# Patient Record
Sex: Female | Born: 1985 | Race: White | Hispanic: No | Smoking: Never smoker
Health system: Southern US, Community
[De-identification: ages and names within clinical notes are randomized; demographics above are authoritative.]

## PROBLEM LIST (undated history)

## (undated) DIAGNOSIS — F419 Anxiety disorder, unspecified: Secondary | ICD-10-CM

## (undated) DIAGNOSIS — J45909 Unspecified asthma, uncomplicated: Secondary | ICD-10-CM

## (undated) HISTORY — PX: DILATION AND CURETTAGE OF UTERUS: SHX78

## (undated) HISTORY — PX: TONSILLECTOMY: SUR1361

---

## 2021-03-04 ENCOUNTER — Other Ambulatory Visit: Payer: Self-pay | Admitting: Family Medicine

## 2021-03-04 DIAGNOSIS — R928 Other abnormal and inconclusive findings on diagnostic imaging of breast: Secondary | ICD-10-CM

## 2021-03-20 ENCOUNTER — Other Ambulatory Visit: Payer: Self-pay | Admitting: Family Medicine

## 2021-03-20 ENCOUNTER — Ambulatory Visit
Admission: RE | Admit: 2021-03-20 | Discharge: 2021-03-20 | Disposition: A | Discharge status: Home / Self Care | Payer: Self-pay | Source: Ambulatory Visit | Attending: Family Medicine | Admitting: Family Medicine

## 2021-03-20 ENCOUNTER — Ambulatory Visit
Admission: RE | Admit: 2021-03-20 | Discharge: 2021-03-20 | Disposition: A | Discharge status: Home / Self Care | Payer: Medicaid Other | Source: Ambulatory Visit | Attending: Family Medicine | Admitting: Family Medicine

## 2021-03-20 ENCOUNTER — Other Ambulatory Visit: Payer: Self-pay

## 2021-03-20 DIAGNOSIS — R928 Other abnormal and inconclusive findings on diagnostic imaging of breast: Secondary | ICD-10-CM

## 2021-03-20 DIAGNOSIS — N6310 Unspecified lump in the right breast, unspecified quadrant: Secondary | ICD-10-CM

## 2021-09-19 ENCOUNTER — Inpatient Hospital Stay: Admission: RE | Admit: 2021-09-19 | Payer: Medicaid Other | Source: Ambulatory Visit

## 2021-12-03 ENCOUNTER — Other Ambulatory Visit: Payer: Medicaid Other

## 2021-12-06 ENCOUNTER — Inpatient Hospital Stay: Admission: RE | Admit: 2021-12-06 | Payer: Medicaid Other | Source: Ambulatory Visit

## 2021-12-17 ENCOUNTER — Other Ambulatory Visit: Payer: Self-pay | Admitting: Family Medicine

## 2021-12-17 ENCOUNTER — Ambulatory Visit
Admission: RE | Admit: 2021-12-17 | Discharge: 2021-12-17 | Disposition: A | Discharge status: Home / Self Care | Payer: Medicaid Other | Source: Ambulatory Visit | Attending: Family Medicine | Admitting: Family Medicine

## 2021-12-17 DIAGNOSIS — N6310 Unspecified lump in the right breast, unspecified quadrant: Secondary | ICD-10-CM

## 2021-12-18 ENCOUNTER — Ambulatory Visit
Admission: RE | Admit: 2021-12-18 | Discharge: 2021-12-18 | Disposition: A | Discharge status: Home / Self Care | Payer: Medicaid Other | Source: Ambulatory Visit | Attending: Family Medicine | Admitting: Family Medicine

## 2021-12-18 DIAGNOSIS — N6310 Unspecified lump in the right breast, unspecified quadrant: Secondary | ICD-10-CM

## 2022-01-03 ENCOUNTER — Ambulatory Visit: Payer: Self-pay | Admitting: Surgery

## 2022-01-03 DIAGNOSIS — N63 Unspecified lump in unspecified breast: Secondary | ICD-10-CM

## 2022-01-03 DIAGNOSIS — N6341 Unspecified lump in right breast, subareolar: Secondary | ICD-10-CM

## 2022-01-03 HISTORY — DX: Unspecified lump in unspecified breast: N63.0

## 2022-01-03 NOTE — H&P (View-Only) (Signed)
 Subjective   Chief Complaint: Breast Mass     History of Present Illness: Kathy Russell is a 36 y.o. female who is seen today as an office consultation at the request of Dr. Hodges for evaluation of Breast Mass .    This is a 36-year-old female in good health who presents with a family history of breast cancer in her twin sister diagnosed in February 2022 at age 36.  Her sister lives in [place].  Apparently, her sister presented with a large palpable mass that had been present for some time.  She underwent central breast excision followed by chemotherapy and radiation.  Apparently the cancer had spread to the lymph nodes.  Her sister did have genetic testing that was reportedly negative.  The patient today had a baseline mammogram in 2022 that revealed a possible mass in the right retroareolar region at 9:00 measuring 5 mm in diameter.  6-month follow-up ultrasound was recommended.  This was performed in June of this year which revealed that the mass had enlarged slightly.  It now measures 6 x 3 x 6 mm.  No sign of axillary lymphadenopathy.  She underwent biopsy of this area on ultrasound-guidance.  Biopsy revealed benign breast tissue with a focally dilated duct but this was felt to be discordant.  She is referred to us to discuss excisional biopsy.   Review of Systems: A complete review of systems was obtained from the patient.  I have reviewed this information and discussed as appropriate with the patient.  See HPI as well for other ROS.  ROS    Medical History: Past Medical History:  Diagnosis Date   Asthma, unspecified asthma severity, unspecified whether complicated, unspecified whether persistent     Patient Active Problem List  Diagnosis   Family history of breast cancer in sister    Past Surgical History:  Procedure Laterality Date   DILATION AND CURETTAGE, DIAGNOSTIC / THERAPEUTIC     TONSILLECTOMY       Allergies  Allergen Reactions   Penicillins Anaphylaxis  and Shortness Of Breath    Pt states "it makes my lungs close up, it happened when I was really young."   Latex Rash    No current outpatient medications on file prior to visit.   No current facility-administered medications on file prior to visit.    Family History  Problem Relation Age of Onset   Obesity Mother    High blood pressure (Hypertension) Mother    Hyperlipidemia (Elevated cholesterol) Mother    Diabetes Mother    Obesity Father    High blood pressure (Hypertension) Father    Coronary Artery Disease (Blocked arteries around heart) Father    Diabetes Father    Obesity Sister    High blood pressure (Hypertension) Sister    Hyperlipidemia (Elevated cholesterol) Sister    Breast cancer Sister    Diabetes Sister    Obesity Brother    High blood pressure (Hypertension) Brother    Diabetes Brother    Stroke Maternal Grandmother      Social History   Tobacco Use  Smoking Status Never  Smokeless Tobacco Never     Social History   Socioeconomic History   Marital status: Divorced  Tobacco Use   Smoking status: Never   Smokeless tobacco: Never  Vaping Use   Vaping Use: Never used  Substance and Sexual Activity   Alcohol use: Never   Drug use: Never    Objective:    Vitals:   01/03/22   1025  BP: 118/62  Pulse: 66  Temp: 36.6 C (97.9 F)  SpO2: 99%  Weight: 55 kg (121 lb 3.2 oz)  Height: 167.6 cm (5\' 6" )    Body mass index is 19.56 kg/m.  Physical Exam   Constitutional:  WDWN in NAD, conversant, no obvious deformities; lying in bed comfortably Eyes:  Pupils equal, round; sclera anicteric; moist conjunctiva; no lid lag HENT:  Oral mucosa moist; good dentition  Neck:  No masses palpated, trachea midline; no thyromegaly Lungs:  CTA bilaterally; normal respiratory effort Breasts:  symmetric, no nipple changes; no palpable masses or lymphadenopathy on either side CV:  Regular rate and rhythm; no murmurs; extremities well-perfused with no edema Abd:   +bowel sounds, soft, non-tender, no palpable organomegaly; no palpable hernias Musc: Normal gait; no apparent clubbing or cyanosis in extremities Lymphatic:  No palpable cervical or axillary lymphadenopathy Skin:  Warm, dry; no sign of jaundice Psychiatric - alert and oriented x 4; calm mood and affect   Labs, Imaging and Diagnostic Testing: Diagnosis Breast, right, needle core biopsy, retroareolar, 9 o'clock, ribbon clip - BENIGN BREAST TISSUE WITH A FOCALLY DILATED DUCT (THIS MAY OR MAY NOT BE CORRELATIVE; RADIOLOGIC CORRELATION NECESSARY). [facility]scopic [facility] was informed of the diagnosis on 12/19/2021. 12/21/2021 MD Pathologist, Electronic Signature  CLINICAL DATA:  Six-month follow-up of a right breast mass. The patient's sister was diagnosed with breast cancer at the age of 76.   EXAM: ULTRASOUND OF THE RIGHT BREAST   COMPARISON:  Previous studies   FINDINGS: Targeted ultrasound is performed, showing an indeterminate mass in the right breast at 9 o'clock in the retroareolar region measuring 6 x 3 x 6 mm today versus 5 x 3 x 5 mm previously. No axillary adenopathy.   IMPRESSION: Growing right breast mass at 9 o'clock in the retroareolar region.   RECOMMENDATION: Recommend ultrasound-guided biopsy of the right breast mass at 9 o'clock in the retroareolar region.   I have discussed the findings and recommendations with the patient. If applicable, a reminder letter will be sent to the patient regarding the next appointment.   BI-RADS CATEGORY  4: Suspicious.     Electronically Signed   By: 31 III M.D.   On: 12/17/2021 12:09  CLINICAL DATA:  Screening recall for a possible right breast mass. Screening exam was the baseline study.   EXAM: DIGITAL DIAGNOSTIC UNILATERAL RIGHT MAMMOGRAM WITH TOMOSYNTHESIS AND CAD; ULTRASOUND RIGHT BREAST LIMITED   TECHNIQUE: Right digital diagnostic mammography and breast  tomosynthesis was performed. The images were evaluated with computer-aided detection.; Targeted ultrasound examination of the right breast was performed   COMPARISON:  02/26/2021   ACR Breast Density Category b: There are scattered areas of fibroglandular density.   FINDINGS: The possible mass noted in the lateral retroareolar right breast is not visualized on the spot compression images. There are no defined masses on the spot-compression images, no areas of architectural distortion and no suspicious calcifications.   Targeted right breast ultrasound is performed, showing a small hypo to anechoic mass, containing a few internal echoes and 1 peripheral septation, in the 9 o'clock, anterior, retroareolar breast, measuring 5 x 3 x 5 mm, consistent in size, shape and location to the mammographic mass. The mass shows no internal blood flow on color Doppler analysis.   IMPRESSION: 1. Probably benign 5 mm mass in the right breast, 9 o'clock retroareolar, consistent with a complicated cyst. Short-term follow-up recommended.   RECOMMENDATION:  Right breast ultrasound in 6 months to reassess the 9 o'clock retroareolar probably benign mass.   I have discussed the findings and recommendations with the patient. If applicable, a reminder letter will be sent to the patient regarding the next appointment.   BI-RADS CATEGORY  3: Probably benign.     Electronically Signed   By: Amie Portland M.D.   On: 03/20/2021 14:36    Assessment and Plan:  Diagnoses and all orders for this visit:  Subareolar mass of right breast  The patient has a strong family history of breast cancer in her twin sister.  The biopsy was felt to be discordant.  There is a strong indication for excisional biopsy of this area.  Right breast radioactive seed localized excisional breast biopsy.  The surgical procedure has been discussed with the patient.  Potential risks, benefits, alternative treatments, and expected  outcomes have been explained.  All of the patient's questions at this time have been answered.  The likelihood of reaching the patient's treatment goal is good.  The patient understand the proposed surgical procedure and wishes to proceed.    Lissa Morales, MD  01/03/2022 1:39 PM

## 2022-01-03 NOTE — H&P (Signed)
Subjective   Chief Complaint: Breast Mass     History of Present Illness: Kathy Russell is a 36 y.o. female who is seen today as an office consultation at the request of Dr. Yetta Flock for evaluation of Breast Mass .    This is a 36 year old female in good health who presents with a family history of breast cancer in her twin sister diagnosed in February 2022 at age 23.  Her sister lives in [place].  Apparently, her sister presented with a large palpable mass that had been present for some time.  She underwent central breast excision followed by chemotherapy and radiation.  Apparently the cancer had spread to the lymph nodes.  Her sister did have genetic testing that was reportedly negative.  The patient today had a baseline mammogram in 2022 that revealed a possible mass in the right retroareolar region at 9:00 measuring 5 mm in diameter.  63-month follow-up ultrasound was recommended.  This was performed in June of this year which revealed that the mass had enlarged slightly.  It now measures 6 x 3 x 6 mm.  No sign of axillary lymphadenopathy.  She underwent biopsy of this area on ultrasound-guidance.  Biopsy revealed benign breast tissue with a focally dilated duct but this was felt to be discordant.  She is referred to [place] to discuss excisional biopsy.   Review of Systems: A complete review of systems was obtained from the patient.  I have reviewed this information and discussed as appropriate with the patient.  See HPI as well for other ROS.  ROS    Medical History: Past Medical History:  Diagnosis Date   Asthma, unspecified asthma severity, unspecified whether complicated, unspecified whether persistent     Patient Active Problem List  Diagnosis   Family history of breast cancer in sister    Past Surgical History:  Procedure Laterality Date   DILATION AND CURETTAGE, DIAGNOSTIC / THERAPEUTIC     TONSILLECTOMY       Allergies  Allergen Reactions   Penicillins Anaphylaxis  and Shortness Of Breath    Pt states "it makes my lungs close up, it happened when I was really young."   Latex Rash    No current outpatient medications on file prior to visit.   No current facility-administered medications on file prior to visit.    Family History  Problem Relation Age of Onset   Obesity Mother    High blood pressure (Hypertension) Mother    Hyperlipidemia (Elevated cholesterol) Mother    Diabetes Mother    Obesity Father    High blood pressure (Hypertension) Father    Coronary Artery Disease (Blocked arteries around heart) Father    Diabetes Father    Obesity Sister    High blood pressure (Hypertension) Sister    Hyperlipidemia (Elevated cholesterol) Sister    Breast cancer Sister    Diabetes Sister    Obesity Brother    High blood pressure (Hypertension) Brother    Diabetes Brother    Stroke Maternal Grandmother      Social History   Tobacco Use  Smoking Status Never  Smokeless Tobacco Never     Social History   Socioeconomic History   Marital status: Divorced  Tobacco Use   Smoking status: Never   Smokeless tobacco: Never  Vaping Use   Vaping Use: Never used  Substance and Sexual Activity   Alcohol use: Never   Drug use: Never    Objective:    Vitals:   01/03/22  1025  BP: 118/62  Pulse: 66  Temp: 36.6 C (97.9 F)  SpO2: 99%  Weight: 55 kg (121 lb 3.2 oz)  Height: 167.6 cm (5\' 6" )    Body mass index is 19.56 kg/m.  Physical Exam   Constitutional:  WDWN in NAD, conversant, no obvious deformities; lying in bed comfortably Eyes:  Pupils equal, round; sclera anicteric; moist conjunctiva; no lid lag HENT:  Oral mucosa moist; good dentition  Neck:  No masses palpated, trachea midline; no thyromegaly Lungs:  CTA bilaterally; normal respiratory effort Breasts:  symmetric, no nipple changes; no palpable masses or lymphadenopathy on either side CV:  Regular rate and rhythm; no murmurs; extremities well-perfused with no edema Abd:   +bowel sounds, soft, non-tender, no palpable organomegaly; no palpable hernias Musc: Normal gait; no apparent clubbing or cyanosis in extremities Lymphatic:  No palpable cervical or axillary lymphadenopathy Skin:  Warm, dry; no sign of jaundice Psychiatric - alert and oriented x 4; calm mood and affect   Labs, Imaging and Diagnostic Testing: Diagnosis Breast, right, needle core biopsy, retroareolar, 9 o'clock, ribbon clip - BENIGN BREAST TISSUE WITH A FOCALLY DILATED DUCT (THIS MAY OR MAY NOT BE CORRELATIVE; RADIOLOGIC CORRELATION NECESSARY). [facility]scopic [facility] was informed of the diagnosis on 12/19/2021. 12/21/2021 MD Pathologist, Electronic Signature  CLINICAL DATA:  Six-month follow-up of a right breast mass. The patient's sister was diagnosed with breast cancer at the age of 76.   EXAM: ULTRASOUND OF THE RIGHT BREAST   COMPARISON:  Previous studies   FINDINGS: Targeted ultrasound is performed, showing an indeterminate mass in the right breast at 9 o'clock in the retroareolar region measuring 6 x 3 x 6 mm today versus 5 x 3 x 5 mm previously. No axillary adenopathy.   IMPRESSION: Growing right breast mass at 9 o'clock in the retroareolar region.   RECOMMENDATION: Recommend ultrasound-guided biopsy of the right breast mass at 9 o'clock in the retroareolar region.   I have discussed the findings and recommendations with the patient. If applicable, a reminder letter will be sent to the patient regarding the next appointment.   BI-RADS CATEGORY  4: Suspicious.     Electronically Signed   By: 31 III M.D.   On: 12/17/2021 12:09  CLINICAL DATA:  Screening recall for a possible right breast mass. Screening exam was the baseline study.   EXAM: DIGITAL DIAGNOSTIC UNILATERAL RIGHT MAMMOGRAM WITH TOMOSYNTHESIS AND CAD; ULTRASOUND RIGHT BREAST LIMITED   TECHNIQUE: Right digital diagnostic mammography and breast  tomosynthesis was performed. The images were evaluated with computer-aided detection.; Targeted ultrasound examination of the right breast was performed   COMPARISON:  02/26/2021   ACR Breast Density Category b: There are scattered areas of fibroglandular density.   FINDINGS: The possible mass noted in the lateral retroareolar right breast is not visualized on the spot compression images. There are no defined masses on the spot-compression images, no areas of architectural distortion and no suspicious calcifications.   Targeted right breast ultrasound is performed, showing a small hypo to anechoic mass, containing a few internal echoes and 1 peripheral septation, in the 9 o'clock, anterior, retroareolar breast, measuring 5 x 3 x 5 mm, consistent in size, shape and location to the mammographic mass. The mass shows no internal blood flow on color Doppler analysis.   IMPRESSION: 1. Probably benign 5 mm mass in the right breast, 9 o'clock retroareolar, consistent with a complicated cyst. Short-term follow-up recommended.   RECOMMENDATION:  Right breast ultrasound in 6 months to reassess the 9 o'clock retroareolar probably benign mass.   I have discussed the findings and recommendations with the patient. If applicable, a reminder letter will be sent to the patient regarding the next appointment.   BI-RADS CATEGORY  3: Probably benign.     Electronically Signed   By: Amie Portland M.D.   On: 03/20/2021 14:36    Assessment and Plan:  Diagnoses and all orders for this visit:  Subareolar mass of right breast  The patient has a strong family history of breast cancer in her twin sister.  The biopsy was felt to be discordant.  There is a strong indication for excisional biopsy of this area.  Right breast radioactive seed localized excisional breast biopsy.  The surgical procedure has been discussed with the patient.  Potential risks, benefits, alternative treatments, and expected  outcomes have been explained.  All of the patient's questions at this time have been answered.  The likelihood of reaching the patient's treatment goal is good.  The patient understand the proposed surgical procedure and wishes to proceed.    Lissa Morales, MD  01/03/2022 1:39 PM

## 2022-01-08 ENCOUNTER — Other Ambulatory Visit: Payer: Self-pay | Admitting: Surgery

## 2022-01-08 DIAGNOSIS — N6341 Unspecified lump in right breast, subareolar: Secondary | ICD-10-CM

## 2022-01-21 ENCOUNTER — Encounter (HOSPITAL_BASED_OUTPATIENT_CLINIC_OR_DEPARTMENT_OTHER): Payer: Self-pay | Admitting: Surgery

## 2022-01-21 ENCOUNTER — Other Ambulatory Visit: Payer: Self-pay

## 2022-01-21 MED ORDER — CHLORHEXIDINE GLUCONATE CLOTH 2 % EX PADS: 6.0000 | MEDICATED_PAD | Freq: Once | CUTANEOUS | Status: DC

## 2022-01-24 ENCOUNTER — Ambulatory Visit
Admission: RE | Admit: 2022-01-24 | Discharge: 2022-01-24 | Disposition: A | Discharge status: Home / Self Care | Payer: Medicaid Other | Source: Ambulatory Visit | Attending: Surgery | Admitting: Surgery

## 2022-01-24 DIAGNOSIS — N6341 Unspecified lump in right breast, subareolar: Secondary | ICD-10-CM

## 2022-01-28 ENCOUNTER — Ambulatory Visit
Admission: RE | Admit: 2022-01-28 | Discharge: 2022-01-28 | Disposition: A | Discharge status: Home / Self Care | Payer: Medicaid Other | Source: Ambulatory Visit | Attending: Surgery | Admitting: Surgery

## 2022-01-28 ENCOUNTER — Ambulatory Visit (HOSPITAL_BASED_OUTPATIENT_CLINIC_OR_DEPARTMENT_OTHER): Payer: Medicaid Other | Admitting: Anesthesiology

## 2022-01-28 ENCOUNTER — Other Ambulatory Visit: Payer: Self-pay

## 2022-01-28 ENCOUNTER — Encounter (HOSPITAL_BASED_OUTPATIENT_CLINIC_OR_DEPARTMENT_OTHER)
Admission: RE | Disposition: A | Discharge status: Home / Self Care | Payer: Self-pay | Source: Home / Self Care | Attending: Surgery

## 2022-01-28 ENCOUNTER — Encounter (HOSPITAL_BASED_OUTPATIENT_CLINIC_OR_DEPARTMENT_OTHER): Payer: Self-pay | Admitting: Surgery

## 2022-01-28 ENCOUNTER — Ambulatory Visit (HOSPITAL_BASED_OUTPATIENT_CLINIC_OR_DEPARTMENT_OTHER)
Admission: RE | Admit: 2022-01-28 | Discharge: 2022-01-28 | Disposition: A | Discharge status: Home / Self Care | Payer: Medicaid Other | Attending: Surgery | Admitting: Surgery

## 2022-01-28 DIAGNOSIS — N6341 Unspecified lump in right breast, subareolar: Secondary | ICD-10-CM

## 2022-01-28 DIAGNOSIS — N6310 Unspecified lump in the right breast, unspecified quadrant: Secondary | ICD-10-CM | POA: Diagnosis not present

## 2022-01-28 DIAGNOSIS — N6021 Fibroadenosis of right breast: Secondary | ICD-10-CM | POA: Diagnosis present

## 2022-01-28 DIAGNOSIS — Z803 Family history of malignant neoplasm of breast: Secondary | ICD-10-CM | POA: Diagnosis not present

## 2022-01-28 DIAGNOSIS — Z01818 Encounter for other preprocedural examination: Secondary | ICD-10-CM

## 2022-01-28 HISTORY — PX: RADIOACTIVE SEED GUIDED EXCISIONAL BREAST BIOPSY: SHX6490

## 2022-01-28 HISTORY — DX: Anxiety disorder, unspecified: F41.9

## 2022-01-28 HISTORY — DX: Unspecified asthma, uncomplicated: J45.909

## 2022-01-28 LAB — POCT PREGNANCY, URINE: Preg Test, Ur: NEGATIVE

## 2022-01-28 SURGERY — RADIOACTIVE SEED GUIDED BREAST BIOPSY
Anesthesia: General | Site: Breast | Laterality: Right

## 2022-01-28 MED ORDER — 0.9 % SODIUM CHLORIDE (POUR BTL) OPTIME
TOPICAL | Status: DC | PRN
  Administered 2022-01-28: 100 mL

## 2022-01-28 MED ORDER — ONDANSETRON HCL 4 MG/2ML IJ SOLN: 4.0000 mg | Freq: Once | INTRAMUSCULAR | Status: DC | PRN

## 2022-01-28 MED ORDER — ACETAMINOPHEN 500 MG PO TABS
1000.0000 mg | ORAL_TABLET | ORAL | Status: AC
  Administered 2022-01-28: 1000 mg via ORAL

## 2022-01-28 MED ORDER — LIDOCAINE HCL (CARDIAC) PF 100 MG/5ML IV SOSY
PREFILLED_SYRINGE | INTRAVENOUS | Status: DC | PRN
  Administered 2022-01-28: 60 mg via INTRATRACHEAL

## 2022-01-28 MED ORDER — DEXAMETHASONE SODIUM PHOSPHATE 10 MG/ML IJ SOLN
INTRAMUSCULAR | Status: DC | PRN
  Administered 2022-01-28: 10 mg via INTRAVENOUS

## 2022-01-28 MED ORDER — FENTANYL CITRATE (PF) 100 MCG/2ML IJ SOLN: 25.0000 ug | INTRAMUSCULAR | Status: DC | PRN

## 2022-01-28 MED ORDER — BUPIVACAINE-EPINEPHRINE 0.25% -1:200000 IJ SOLN
INTRAMUSCULAR | Status: DC | PRN
  Administered 2022-01-28: 10 mL

## 2022-01-28 MED ORDER — DEXAMETHASONE SODIUM PHOSPHATE 10 MG/ML IJ SOLN
INTRAMUSCULAR | Status: AC
  Filled 2022-01-28: qty 1

## 2022-01-28 MED ORDER — BUPIVACAINE HCL (PF) 0.25 % IJ SOLN
INTRAMUSCULAR | Status: AC
  Filled 2022-01-28: qty 30

## 2022-01-28 MED ORDER — KETOROLAC TROMETHAMINE 30 MG/ML IJ SOLN
INTRAMUSCULAR | Status: AC
  Filled 2022-01-28: qty 1

## 2022-01-28 MED ORDER — CEFAZOLIN SODIUM-DEXTROSE 2-4 GM/100ML-% IV SOLN
INTRAVENOUS | Status: AC
  Filled 2022-01-28: qty 100

## 2022-01-28 MED ORDER — AMISULPRIDE (ANTIEMETIC) 5 MG/2ML IV SOLN: 10.0000 mg | Freq: Once | INTRAVENOUS | Status: DC | PRN

## 2022-01-28 MED ORDER — PROPOFOL 10 MG/ML IV BOLUS
INTRAVENOUS | Status: AC
  Filled 2022-01-28: qty 20

## 2022-01-28 MED ORDER — ONDANSETRON HCL 4 MG/2ML IJ SOLN
INTRAMUSCULAR | Status: DC | PRN
  Administered 2022-01-28: 4 mg via INTRAVENOUS

## 2022-01-28 MED ORDER — FENTANYL CITRATE (PF) 100 MCG/2ML IJ SOLN
INTRAMUSCULAR | Status: DC | PRN
Start: 2022-01-28 — End: 2022-01-28
  Administered 2022-01-28 (×2): 25 ug via INTRAVENOUS
  Administered 2022-01-28: 50 ug via INTRAVENOUS

## 2022-01-28 MED ORDER — PROPOFOL 10 MG/ML IV BOLUS
INTRAVENOUS | Status: DC | PRN
  Administered 2022-01-28: 200 mg via INTRAVENOUS

## 2022-01-28 MED ORDER — KETOROLAC TROMETHAMINE 30 MG/ML IJ SOLN
INTRAMUSCULAR | Status: DC | PRN
  Administered 2022-01-28: 30 mg via INTRAVENOUS

## 2022-01-28 MED ORDER — MIDAZOLAM HCL 5 MG/5ML IJ SOLN
INTRAMUSCULAR | Status: DC | PRN
  Administered 2022-01-28: 2 mg via INTRAVENOUS

## 2022-01-28 MED ORDER — LACTATED RINGERS IV SOLN: INTRAVENOUS | Status: DC

## 2022-01-28 MED ORDER — ACETAMINOPHEN 500 MG PO TABS
ORAL_TABLET | ORAL | Status: AC
  Filled 2022-01-28: qty 2

## 2022-01-28 MED ORDER — LIDOCAINE 2% (20 MG/ML) 5 ML SYRINGE
INTRAMUSCULAR | Status: AC
  Filled 2022-01-28: qty 5

## 2022-01-28 MED ORDER — ONDANSETRON HCL 4 MG/2ML IJ SOLN
INTRAMUSCULAR | Status: AC
  Filled 2022-01-28: qty 2

## 2022-01-28 MED ORDER — OXYCODONE HCL 5 MG/5ML PO SOLN: 5.0000 mg | Freq: Once | ORAL | Status: DC | PRN

## 2022-01-28 MED ORDER — MIDAZOLAM HCL 2 MG/2ML IJ SOLN
INTRAMUSCULAR | Status: AC
  Filled 2022-01-28: qty 2

## 2022-01-28 MED ORDER — VANCOMYCIN HCL 1000 MG IV SOLR
INTRAVENOUS | Status: DC | PRN
  Administered 2022-01-28: 1000 mg via INTRAVENOUS

## 2022-01-28 MED ORDER — FENTANYL CITRATE (PF) 100 MCG/2ML IJ SOLN
INTRAMUSCULAR | Status: AC
  Filled 2022-01-28: qty 2

## 2022-01-28 MED ORDER — OXYCODONE HCL 5 MG PO TABS: 5.0000 mg | ORAL_TABLET | Freq: Once | ORAL | Status: DC | PRN

## 2022-01-28 MED ORDER — BUPIVACAINE-EPINEPHRINE (PF) 0.25% -1:200000 IJ SOLN
INTRAMUSCULAR | Status: AC
  Filled 2022-01-28: qty 30

## 2022-01-28 MED ORDER — VANCOMYCIN HCL IN DEXTROSE 1-5 GM/200ML-% IV SOLN
INTRAVENOUS | Status: AC
Start: 2022-01-28 — End: ?
  Filled 2022-01-28: qty 200

## 2022-01-28 SURGICAL SUPPLY — 50 items
APL PRP STRL LF DISP 70% ISPRP (MISCELLANEOUS) ×1
APL SKNCLS STERI-STRIP NONHPOA (GAUZE/BANDAGES/DRESSINGS) ×1
APPLIER CLIP 9.375 MED OPEN (MISCELLANEOUS)
APR CLP MED 9.3 20 MLT OPN (MISCELLANEOUS)
BENZOIN TINCTURE PRP APPL 2/3 (GAUZE/BANDAGES/DRESSINGS) ×2 IMPLANT
BLADE HEX COATED 2.75 (ELECTRODE) ×2 IMPLANT
BLADE SURG 15 STRL LF DISP TIS (BLADE) ×1 IMPLANT
BLADE SURG 15 STRL SS (BLADE) ×2
CANISTER SUCT 1200ML W/VALVE (MISCELLANEOUS) ×2 IMPLANT
CHLORAPREP W/TINT 26 (MISCELLANEOUS) ×2 IMPLANT
CLIP APPLIE 9.375 MED OPEN (MISCELLANEOUS) IMPLANT
COVER BACK TABLE 60X90IN (DRAPES) ×2 IMPLANT
COVER MAYO STAND STRL (DRAPES) ×2 IMPLANT
COVER PROBE W GEL 5X96 (DRAPES) ×2 IMPLANT
DRAPE LAPAROTOMY 100X72 PEDS (DRAPES) ×2 IMPLANT
DRAPE UTILITY XL STRL (DRAPES) ×2 IMPLANT
DRSG TEGADERM 4X4.75 (GAUZE/BANDAGES/DRESSINGS) ×2 IMPLANT
ELECT REM PT RETURN 9FT ADLT (ELECTROSURGICAL) ×2
ELECTRODE REM PT RTRN 9FT ADLT (ELECTROSURGICAL) ×1 IMPLANT
GAUZE SPONGE 4X4 12PLY STRL LF (GAUZE/BANDAGES/DRESSINGS) ×2 IMPLANT
GLOVE BIO SURGEON STRL SZ7 (GLOVE) ×1 IMPLANT
GLOVE BIOGEL PI IND STRL 7.0 (GLOVE) IMPLANT
GLOVE BIOGEL PI IND STRL 7.5 (GLOVE) ×1 IMPLANT
GLOVE BIOGEL PI INDICATOR 7.0 (GLOVE) ×2
GLOVE BIOGEL PI INDICATOR 7.5 (GLOVE) ×1
GOWN STRL REUS W/ TWL LRG LVL3 (GOWN DISPOSABLE) ×2 IMPLANT
GOWN STRL REUS W/TWL LRG LVL3 (GOWN DISPOSABLE) ×4
ILLUMINATOR WAVEGUIDE N/F (MISCELLANEOUS) IMPLANT
KIT MARKER MARGIN INK (KITS) ×2 IMPLANT
LIGHT WAVEGUIDE WIDE FLAT (MISCELLANEOUS) IMPLANT
NDL HYPO 25X1 1.5 SAFETY (NEEDLE) ×1 IMPLANT
NEEDLE HYPO 25X1 1.5 SAFETY (NEEDLE) ×2 IMPLANT
NS IRRIG 1000ML POUR BTL (IV SOLUTION) ×2 IMPLANT
PACK BASIN DAY SURGERY FS (CUSTOM PROCEDURE TRAY) ×2 IMPLANT
PENCIL SMOKE EVACUATOR (MISCELLANEOUS) ×2 IMPLANT
SLEEVE SCD COMPRESS KNEE MED (STOCKING) ×2 IMPLANT
SPIKE FLUID TRANSFER (MISCELLANEOUS) IMPLANT
SPONGE GAUZE 2X2 8PLY STRL LF (GAUZE/BANDAGES/DRESSINGS) IMPLANT
SPONGE T-LAP 18X18 ~~LOC~~+RFID (SPONGE) IMPLANT
SPONGE T-LAP 4X18 ~~LOC~~+RFID (SPONGE) ×2 IMPLANT
STRIP CLOSURE SKIN 1/2X4 (GAUZE/BANDAGES/DRESSINGS) ×2 IMPLANT
SUT MON AB 4-0 PC3 18 (SUTURE) ×2 IMPLANT
SUT SILK 2 0 SH (SUTURE) IMPLANT
SUT VIC AB 3-0 SH 27 (SUTURE) ×2
SUT VIC AB 3-0 SH 27X BRD (SUTURE) ×1 IMPLANT
SYR CONTROL 10ML LL (SYRINGE) ×2 IMPLANT
TOWEL GREEN STERILE FF (TOWEL DISPOSABLE) ×2 IMPLANT
TRAY FAXITRON CT DISP (TRAY / TRAY PROCEDURE) ×2 IMPLANT
TUBE CONNECTING 20X1/4 (TUBING) ×2 IMPLANT
YANKAUER SUCT BULB TIP NO VENT (SUCTIONS) ×2 IMPLANT

## 2022-01-28 NOTE — Discharge Instructions (Addendum)
[place] Office Phone Number [phone]  BREAST BIOPSY/ PARTIAL MASTECTOMY: POST OP INSTRUCTIONS  Always review your discharge instruction sheet given to you by the facility where your surgery was performed.  IF YOU HAVE DISABILITY OR FAMILY LEAVE FORMS, YOU MUST BRING THEM TO THE OFFICE FOR PROCESSING.  DO NOT GIVE THEM TO YOUR DOCTOR.  A prescription for pain medication may be given to you upon discharge.  Take your pain medication as prescribed, if needed.  If narcotic pain medicine is not needed, then you may take acetaminophen (Tylenol) or ibuprofen (Advil) as needed. Take your usually prescribed medications unless otherwise directed If you need a refill on your pain medication, please contact your pharmacy.  They will contact our office to request authorization.  Prescriptions will not be filled after 5pm or on week-ends. You should eat very light the first 24 hours after surgery, such as soup, crackers, pudding, etc.  Resume your normal diet the day after surgery. Most patients will experience some swelling and bruising in the breast.  Ice packs and a good support bra will help.  Swelling and bruising can take several days to resolve.  It is common to experience some constipation if taking pain medication after surgery.  Increasing fluid intake and taking a stool softener will usually help or prevent this problem from occurring.  A mild laxative (Milk of Magnesia or Miralax) should be taken according to package directions if there are no bowel movements after 48 hours. Unless discharge instructions indicate otherwise, you may remove your bandages 24-48 hours after surgery, and you may shower at that time.  You may have steri-strips (small skin tapes) in place directly over the incision.  These strips should be left on the skin for 7-10 days.  If your surgeon used skin glue on the incision, you may shower in 24 hours.  The glue will flake off over the next 2-3 weeks.  Any  sutures or staples will be removed at the office during your follow-up visit. ACTIVITIES:  You may resume regular daily activities (gradually increasing) beginning the next day.  Wearing a good support bra or sports bra minimizes pain and swelling.  You may have sexual intercourse when it is comfortable. You may drive when you no longer are taking prescription pain medication, you can comfortably wear a seatbelt, and you can safely maneuver your car and apply brakes. RETURN TO WORK:  ______________________________________________________________________________________ Dennis Bast should see your doctor in the office for a follow-up appointment approximately two weeks after your surgery.  Your doctor's nurse will typically make your follow-up appointment when she calls you with your pathology report.  Expect your pathology report 2-3 business days after your surgery.  You may call to check if you do not hear from [place] after three days. OTHER INSTRUCTIONS: _______________________________________________________________________________________________ _____________________________________________________________________________________________________________________________________ _____________________________________________________________________________________________________________________________________ _____________________________________________________________________________________________________________________________________  WHEN TO CALL YOUR DOCTOR: Fever over 101.0 Nausea and/or vomiting. Extreme swelling or bruising. Continued bleeding from incision. Increased pain, redness, or drainage from the incision.  The clinic staff is available to answer your questions during regular business hours.  Please don't hesitate to call and ask to speak to one of the nurses for clinical concerns.  If you have a medical emergency, go to the nearest emergency room or call 911.  A surgeon from [facility] is always on call at the hospital.  For further questions, please visit [email or web address]   Post Anesthesia Home Care Instructions  Activity: Get plenty of rest for the remainder of the  day. A responsible individual must stay with you for 24 hours following the procedure.  For the next 24 hours, DO NOT: -Drive a car -Advertising copywriter -Drink alcoholic beverages -Take any medication unless instructed by your physician -Make any legal decisions or sign important papers.  Meals: Start with liquid foods such as gelatin or soup. Progress to regular foods as tolerated. Avoid greasy, spicy, heavy foods. If nausea and/or vomiting occur, drink only clear liquids until the nausea and/or vomiting subsides. Call your physician if vomiting continues.  Special Instructions/Symptoms: Your throat may feel dry or sore from the anesthesia or the breathing tube placed in your throat during surgery. If this causes discomfort, gargle with warm salt water. The discomfort should disappear within 24 hours.  If you had a scopolamine patch placed behind your ear for the management of post- operative nausea and/or vomiting:  1. The medication in the patch is effective for 72 hours, after which it should be removed.  Wrap patch in a tissue and discard in the trash. Wash hands thoroughly with soap and water. 2. You may remove the patch earlier than 72 hours if you experience unpleasant side effects which may include dry mouth, dizziness or visual disturbances. 3. Avoid touching the patch. Wash your hands with soap and water after contact with the patch.       May have Tylenol today at 1pm 01/28/22 May have Ibuprofen today at 2pm 01/28/22

## 2022-01-28 NOTE — Transfer of Care (Signed)
Immediate Anesthesia Transfer of Care Note  Patient: Kathy Russell  Procedure(s) Performed: RIGHT BREAST RADIOACTIVE SEED LOCALIZED EXCISIONAL BREAST BIOPSY (Right: Breast)  Patient Location: PACU  Anesthesia Type:General  Level of Consciousness: drowsy, patient cooperative and responds to stimulation  Airway & Oxygen Therapy: Patient Spontanous Breathing and Patient connected to face mask oxygen  Post-op Assessment: Report given to RN and Post -op Vital signs reviewed and stable  Post vital signs: Reviewed and stable  Last Vitals:  Vitals Value Taken Time  BP    Temp    Pulse 74 01/28/22 0818  Resp    SpO2 100 % 01/28/22 0818  Vitals shown include unvalidated device data.  Last Pain:  Vitals:   01/28/22 0645  TempSrc: Oral  PainSc: 0-No pain      Patients Stated Pain Goal: 4 (01/28/22 0645)  Complications: No notable events documented.

## 2022-01-28 NOTE — Interval H&P Note (Signed)
History and Physical Interval Note:  01/28/2022 7:14 AM  Kathy Russell  has presented today for surgery, with the diagnosis of * No pre-op diagnosis entered *.  The various methods of treatment have been discussed with the patient and family. After consideration of risks, benefits and other options for treatment, the patient has consented to  Procedure(s): RIGHT BREAST RADIOACTIVE SEED LOCALIZED EXCISIONAL BREAST BIOPSY (Right) as a surgical intervention.  The patient's history has been reviewed, patient examined, no change in status, stable for surgery.  I have reviewed the patient's chart and labs.  Questions were answered to the patient's satisfaction.     Wynona Luna

## 2022-01-28 NOTE — Anesthesia Postprocedure Evaluation (Signed)
Anesthesia Post Note  Patient: Kathy Russell  Procedure(s) Performed: RIGHT BREAST RADIOACTIVE SEED LOCALIZED EXCISIONAL BREAST BIOPSY (Right: Breast)     Patient location during evaluation: PACU Anesthesia Type: General Level of consciousness: awake and alert Pain management: pain level controlled Vital Signs Assessment: post-procedure vital signs reviewed and stable Respiratory status: spontaneous breathing, nonlabored ventilation and respiratory function stable Cardiovascular status: blood pressure returned to baseline and stable Postop Assessment: no apparent nausea or vomiting Anesthetic complications: no   No notable events documented.  Last Vitals:  Vitals:   01/28/22 0900 01/28/22 0919  BP: 116/75 (!) 141/93  Pulse: (!) 51 (!) 55  Resp: 15 18  Temp:  36.5 C  SpO2: 100% 100%    Last Pain:  Vitals:   01/28/22 0919  TempSrc: Oral  PainSc: 0-No pain                 Lucretia Kern

## 2022-01-28 NOTE — Anesthesia Procedure Notes (Signed)
Procedure Name: LMA Insertion Date/Time: 01/28/2022 7:35 AM  Performed by: Thornell Mule, CRNAPre-anesthesia Checklist: Patient identified, Emergency Drugs available, Suction available and Patient being monitored Patient Re-evaluated:Patient Re-evaluated prior to induction Oxygen Delivery Method: Circle system utilized Preoxygenation: Pre-oxygenation with 100% oxygen Induction Type: IV induction LMA: LMA inserted LMA Size: 4.0 Number of attempts: 1 Placement Confirmation: positive ETCO2 Tube secured with: Tape Dental Injury: Teeth and Oropharynx as per pre-operative assessment

## 2022-01-28 NOTE — Op Note (Addendum)
Pre-op Diagnosis:  Right breast mass - discordant biopsy Post-op Diagnosis: same Procedure:  Right radioactive seed localized excisional biopsy Surgeon:  TSUEI,MATTHEW K. Resident:  Dr. Imran Anwar I was personally present during the key and critical portions of this procedure and immediately available throughout the entire procedure, as documented in my operative note.  Anesthesia:  GEN - LMA Indications:  This is a 36-year-old female in good health who presents with a family history of breast cancer in her twin sister diagnosed in February 2022 at age 36.  Her sister lives in [place].  Apparently, her sister presented with a large palpable mass that had been present for some time.  She underwent central breast excision followed by chemotherapy and radiation.  Apparently the cancer had spread to the lymph nodes.  Her sister did have genetic testing that was reportedly negative.   The patient today had a baseline mammogram in 2022 that revealed a possible mass in the right retroareolar region at 9:00 measuring 5 mm in diameter.  6-month follow-up ultrasound was recommended.  This was performed in June of this year which revealed that the mass had enlarged slightly.  It now measures 6 x 3 x 6 mm.  No sign of axillary lymphadenopathy.  She underwent biopsy of this area on ultrasound-guidance.  Biopsy revealed benign breast tissue with a focally dilated duct but this was felt to be discordant.  She is referred to us to discuss excisional biopsy.  Description of procedure: The patient is brought to the operating room placed in supine position on the operating room table. After an adequate level of general anesthesia was obtained, her right breast was prepped with ChloraPrep and draped in sterile fashion. A timeout was taken to ensure the proper patient and proper procedure. We interrogated the breast with the neoprobe. We made a circumareolar incision around the inferolateral side of the nipple after  infiltrating with 0.25% Marcaine. Dissection was carried down in the breast tissue with cautery. We used the neoprobe to guide us towards the radioactive seed. We excised an area of tissue around the radioactive seed 1.5 cm in diameter. The specimen was removed and was oriented with a paint kit. Specimen mammogram showed the radioactive seed as well as the biopsy clip within the specimen. This was sent for pathologic examination. There is no residual radioactivity within the biopsy cavity. We inspected carefully for hemostasis. The wound was thoroughly irrigated. The wound was closed with a deep layer of 3-0 Vicryl and a subcuticular layer of 4-0 Monocryl. Benzoin Steri-Strips were applied. The patient was then extubated and brought to the recovery room in stable condition. All sponge, instrument, and needle counts are correct.  Matthew K. Tsuei, MD, FACS [facility]  General/ Trauma Surgery  01/28/2022 8:04 AM   

## 2022-01-28 NOTE — Anesthesia Preprocedure Evaluation (Addendum)
Anesthesia Evaluation  Patient identified by MRN, date of birth, ID band Patient awake    Reviewed: Allergy & Precautions, NPO status , Patient's Chart, lab work & pertinent test results  History of Anesthesia Complications Negative for: history of anesthetic complications  Airway Mallampati: II  TM Distance: >3 FB Neck ROM: Full    Dental  (+) Dental Advisory Given, Teeth Intact   Pulmonary asthma ,    Pulmonary exam normal        Cardiovascular negative cardio ROS Normal cardiovascular exam     Neuro/Psych Anxiety negative neurological ROS     GI/Hepatic negative GI ROS, Neg liver ROS,   Endo/Other  negative endocrine ROS  Renal/GU negative Renal ROS  negative genitourinary   Musculoskeletal negative musculoskeletal ROS (+)   Abdominal   Peds  Hematology negative hematology ROS (+)   Anesthesia Other Findings RIGHT RETROAREOLAR BREAST MASS  Reproductive/Obstetrics                            Anesthesia Physical Anesthesia Plan  ASA: 2  Anesthesia Plan: General   Post-op Pain Management: Tylenol PO (pre-op)* and Toradol IV (intra-op)*   Induction: Intravenous  PONV Risk Score and Plan: 3 and Ondansetron, Dexamethasone, Midazolam and Treatment may vary due to age or medical condition  Airway Management Planned: LMA  Additional Equipment: None  Intra-op Plan:   Post-operative Plan: Extubation in OR  Informed Consent: I have reviewed the patients History and Physical, chart, labs and discussed the procedure including the risks, benefits and alternatives for the proposed anesthesia with the patient or authorized representative who has indicated his/her understanding and acceptance.     Dental advisory given  Plan Discussed with:   Anesthesia Plan Comments:         Anesthesia Quick Evaluation

## 2022-01-29 ENCOUNTER — Encounter (HOSPITAL_BASED_OUTPATIENT_CLINIC_OR_DEPARTMENT_OTHER): Payer: Self-pay | Admitting: Surgery

## 2022-01-29 LAB — SURGICAL PATHOLOGY

## 2022-02-26 ENCOUNTER — Encounter (HOSPITAL_COMMUNITY): Payer: Self-pay

## 2022-10-20 ENCOUNTER — Other Ambulatory Visit: Payer: Self-pay | Admitting: Family Medicine

## 2022-10-20 DIAGNOSIS — Z1231 Encounter for screening mammogram for malignant neoplasm of breast: Secondary | ICD-10-CM

## 2022-11-20 ENCOUNTER — Ambulatory Visit: Payer: Medicaid Other

## 2022-11-28 ENCOUNTER — Ambulatory Visit: Payer: Medicaid Other

## 2022-12-30 ENCOUNTER — Ambulatory Visit: Payer: Medicaid Other

## 2023-06-19 ENCOUNTER — Ambulatory Visit: Payer: Medicaid Other

## 2023-07-10 ENCOUNTER — Ambulatory Visit: Payer: Medicaid Other

## 2023-07-27 ENCOUNTER — Ambulatory Visit: Payer: Medicaid Other

## 2023-08-17 IMAGING — MG MM DIGITAL DIAGNOSTIC UNILAT*R* W/ TOMO W/ CAD
4 series · 4 of 12 positions shown · non-contrast
Comparison: 02/26/2021

CLINICAL DATA: Screening recall for a possible right breast mass.
Screening exam was the baseline study.

EXAM:
DIGITAL DIAGNOSTIC UNILATERAL RIGHT MAMMOGRAM WITH TOMOSYNTHESIS AND
CAD; ULTRASOUND RIGHT BREAST LIMITED
TECHNIQUE: Right digital diagnostic mammography and breast tomosynthesis was
performed. The images were evaluated with computer-aided detection.;
Targeted ultrasound examination of the right breast was performed

[R CC synth-2D]
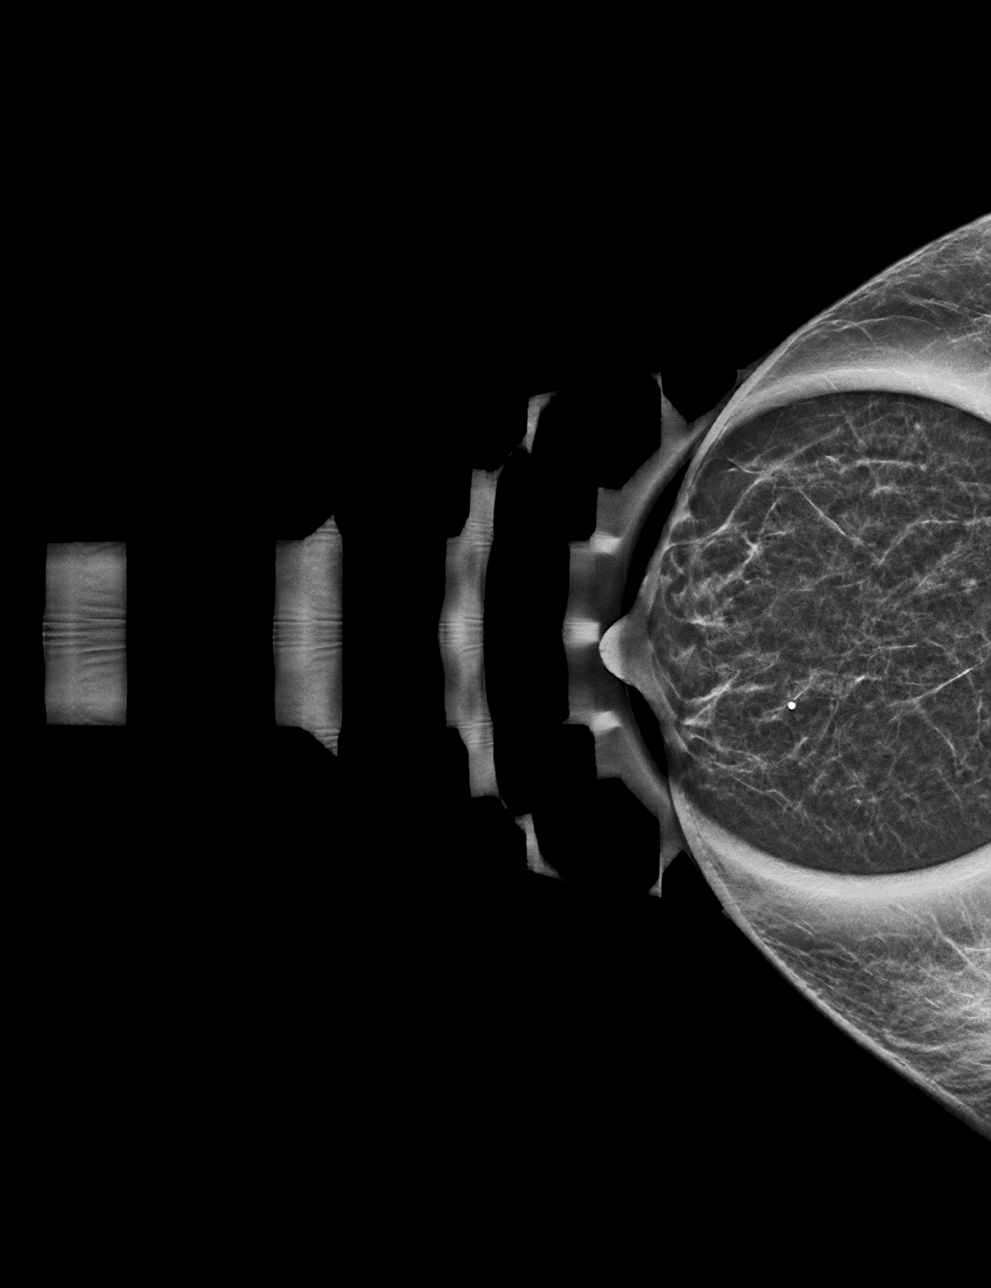

[R MLO synth-2D]
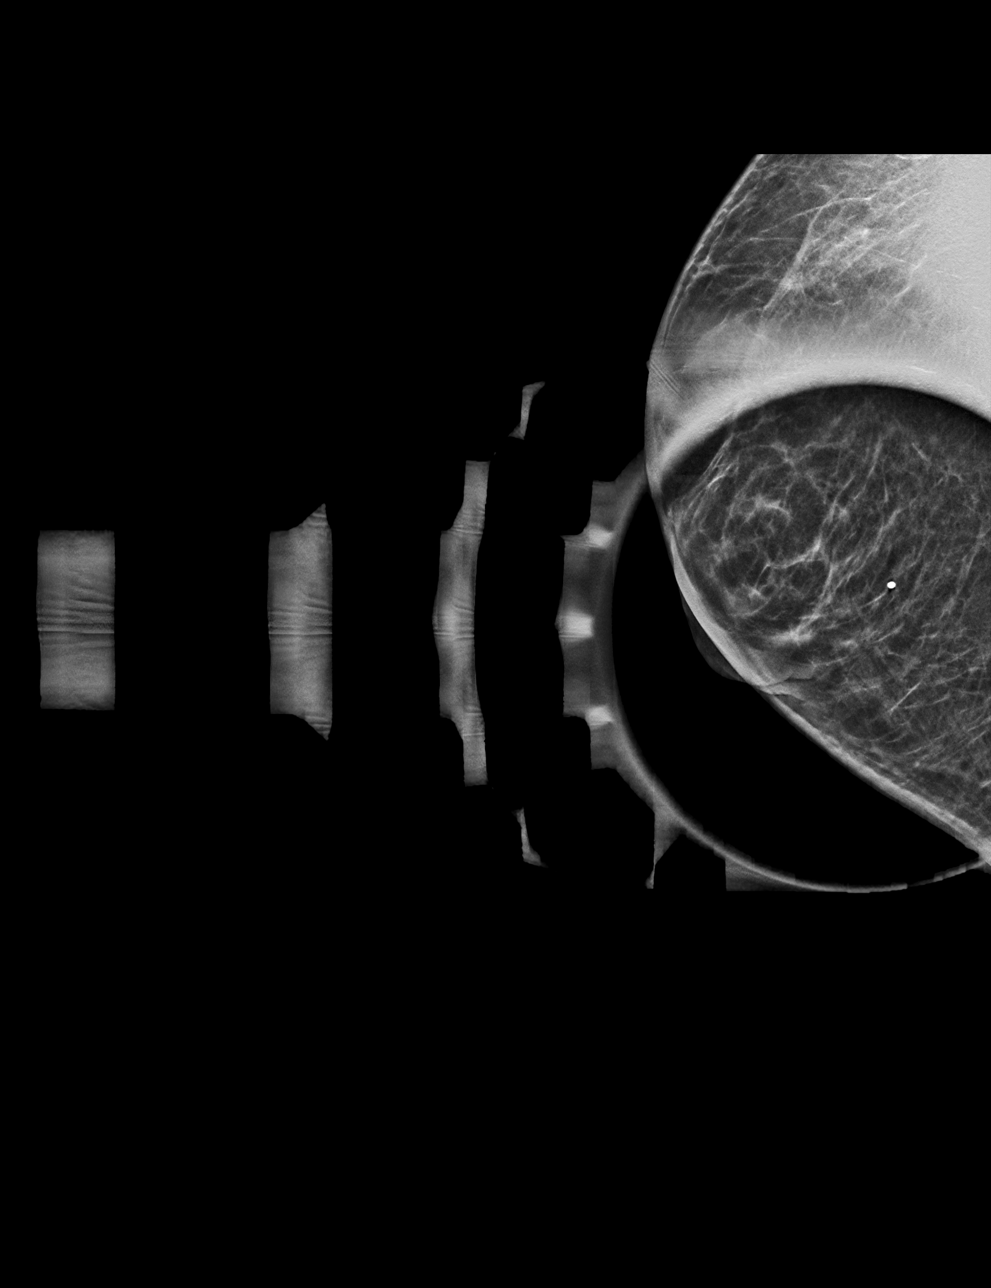

[R CC tomo · tomo slice 17/33.0]
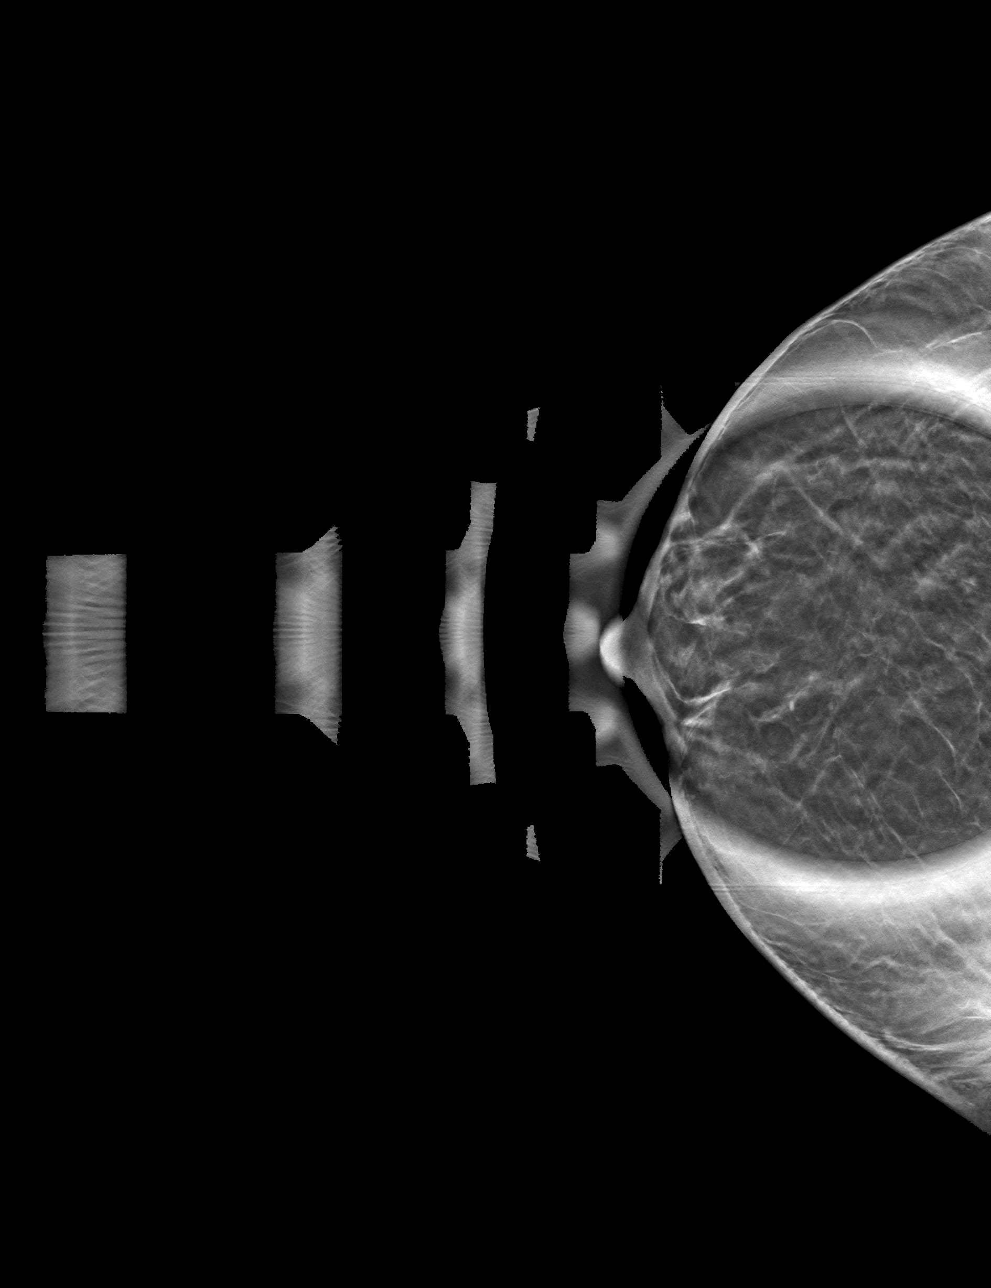

[R MLO tomo · tomo slice 19/36.0]
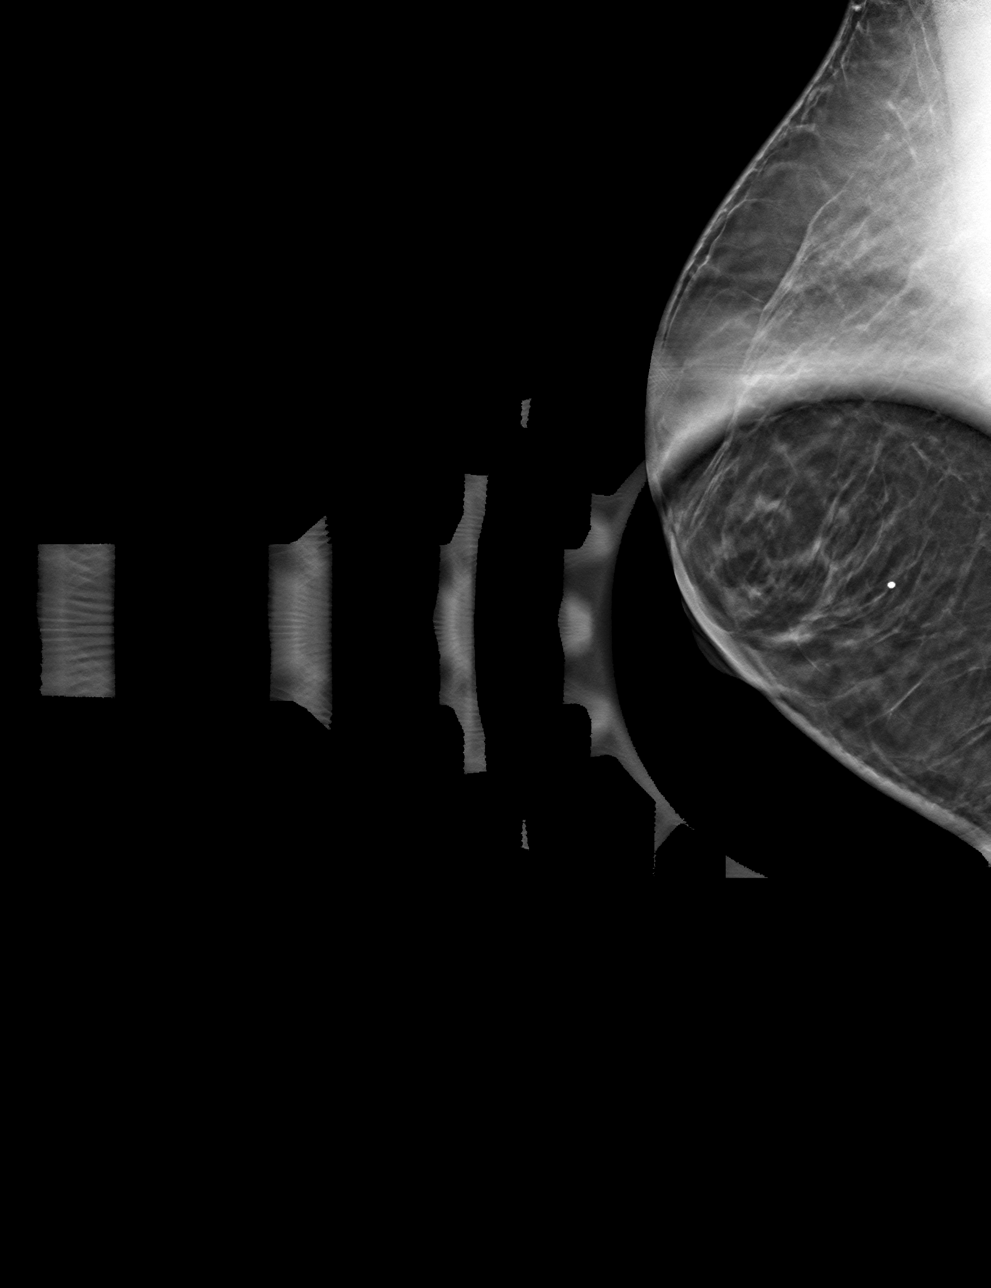

[4 of 12 positions shown; findings below may reference images not displayed]

ACR Breast Density Category b: There are scattered areas of
fibroglandular density.
FINDINGS: The possible mass noted in the lateral retroareolar right breast is
not visualized on the spot compression images. There are no defined
masses on the spot-compression images, no areas of architectural
distortion and no suspicious calcifications.

Targeted right breast ultrasound is performed, showing a small hypo
to anechoic mass, containing a few internal echoes and 1 peripheral
septation, in the 9 o'clock, anterior, retroareolar breast,
measuring 5 x 3 x 5 mm, consistent in size, shape and location to
the mammographic mass. The mass shows no internal blood flow on
color Doppler analysis.
IMPRESSION: 1. Probably benign 5 mm mass in the right breast, 9 o'clock
retroareolar, consistent with a complicated cyst. Short-term
follow-up recommended.

RECOMMENDATION:
Right breast ultrasound in 6 months to reassess the 9 o'clock
retroareolar probably benign mass.

I have discussed the findings and recommendations with the patient.
If applicable, a reminder letter will be sent to the patient
regarding the next appointment.

BI-RADS CATEGORY  3: Probably benign.

## 2023-08-17 IMAGING — US US BREAST*R* LIMITED INC AXILLA
1 series · 7 of 7 positions shown · non-contrast
Comparison: 02/26/2021

CLINICAL DATA: Screening recall for a possible right breast mass.
Screening exam was the baseline study.

EXAM:
DIGITAL DIAGNOSTIC UNILATERAL RIGHT MAMMOGRAM WITH TOMOSYNTHESIS AND
CAD; ULTRASOUND RIGHT BREAST LIMITED
TECHNIQUE: Right digital diagnostic mammography and breast tomosynthesis was
performed. The images were evaluated with computer-aided detection.;
Targeted ultrasound examination of the right breast was performed

[Series 1: us breast*right* limited inc axilla · 0.05mm/px · 7 of 7 slices shown]
[im 1/7]
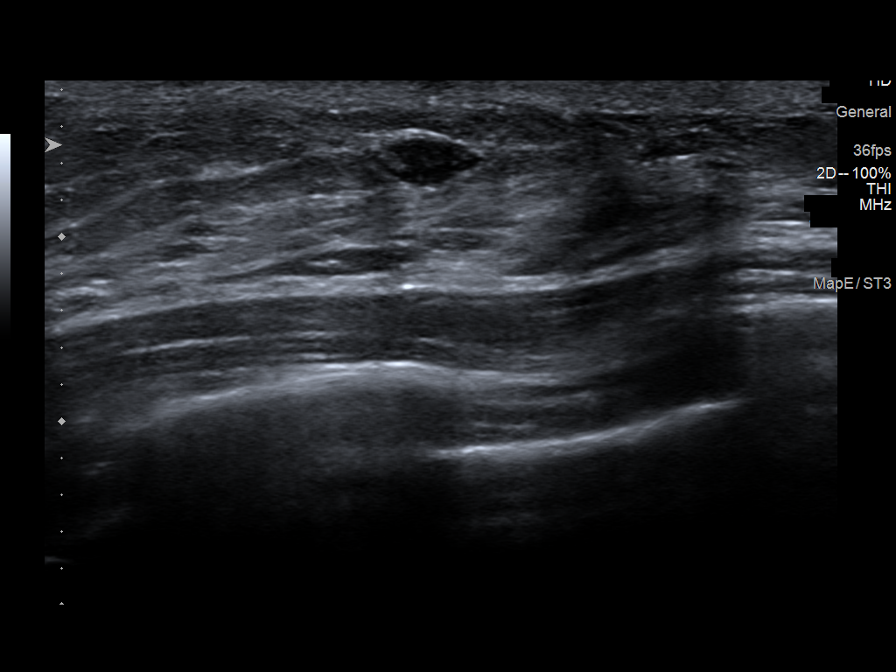
[im 2/7]
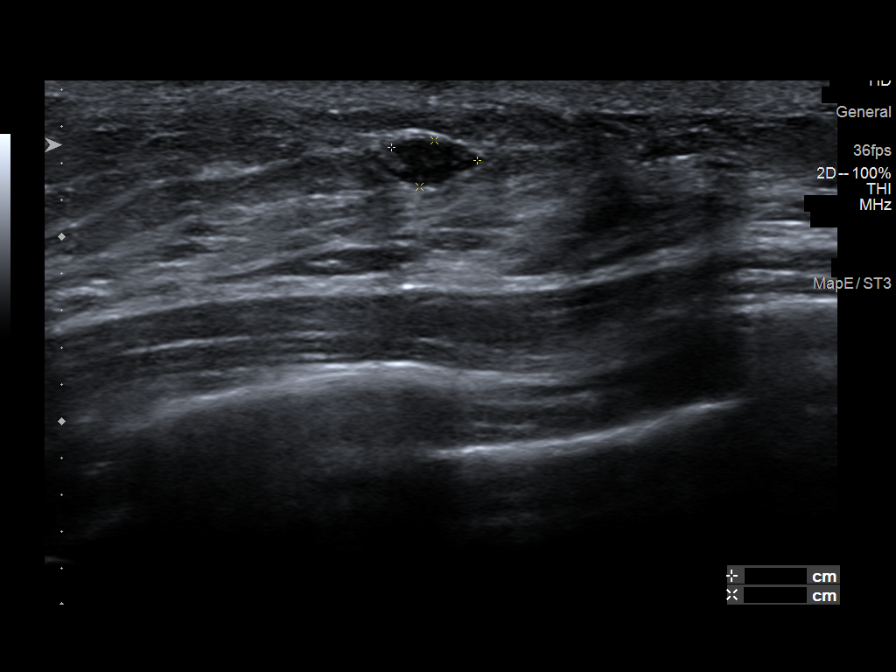
[im 3/7]
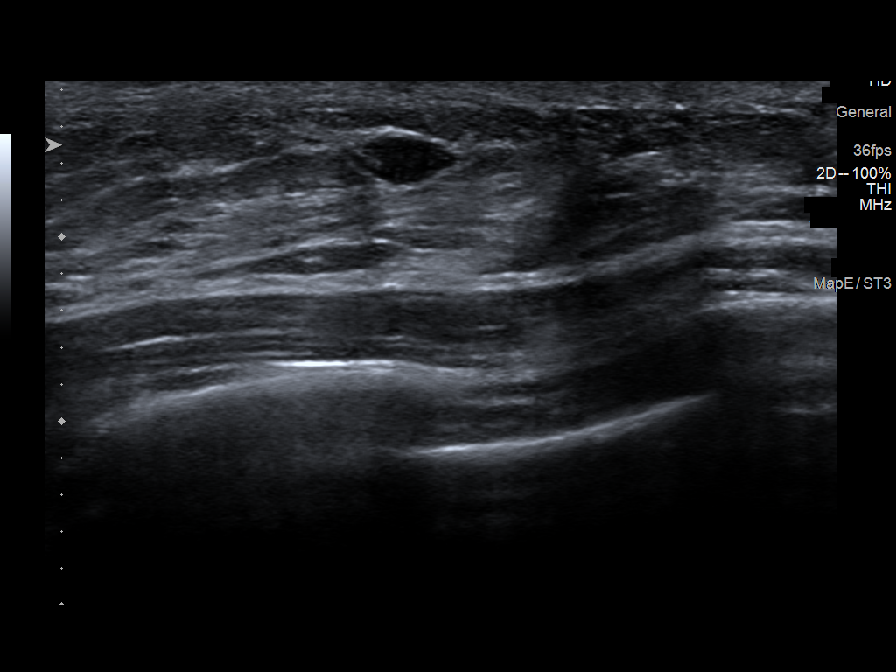
[im 4/7]
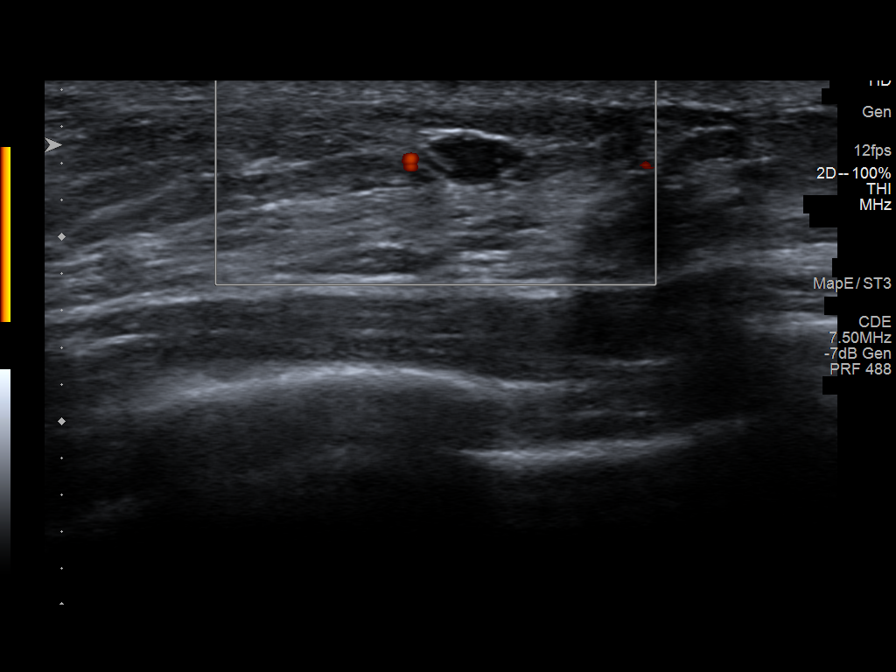
[im 5/7]
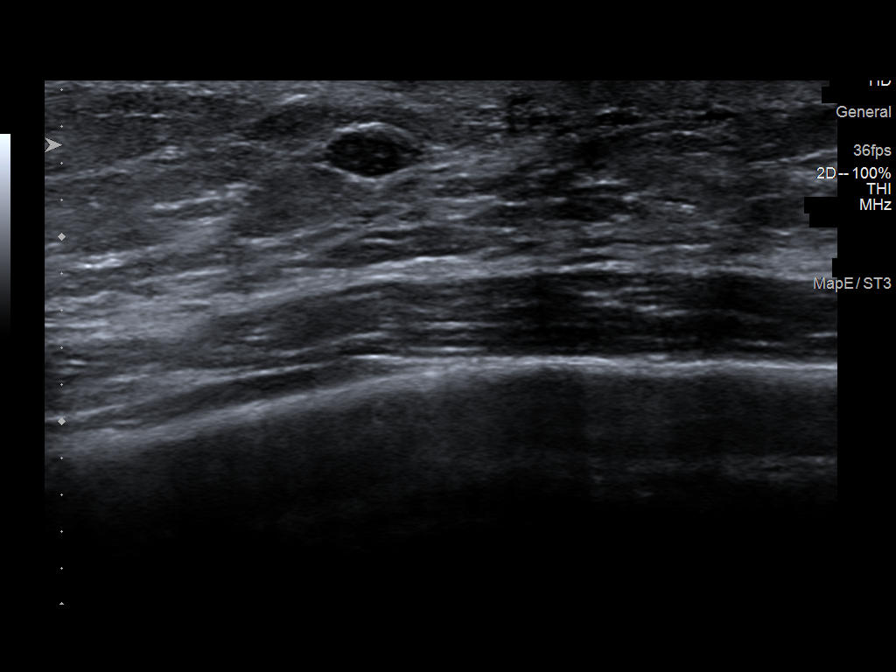
[im 6/7]
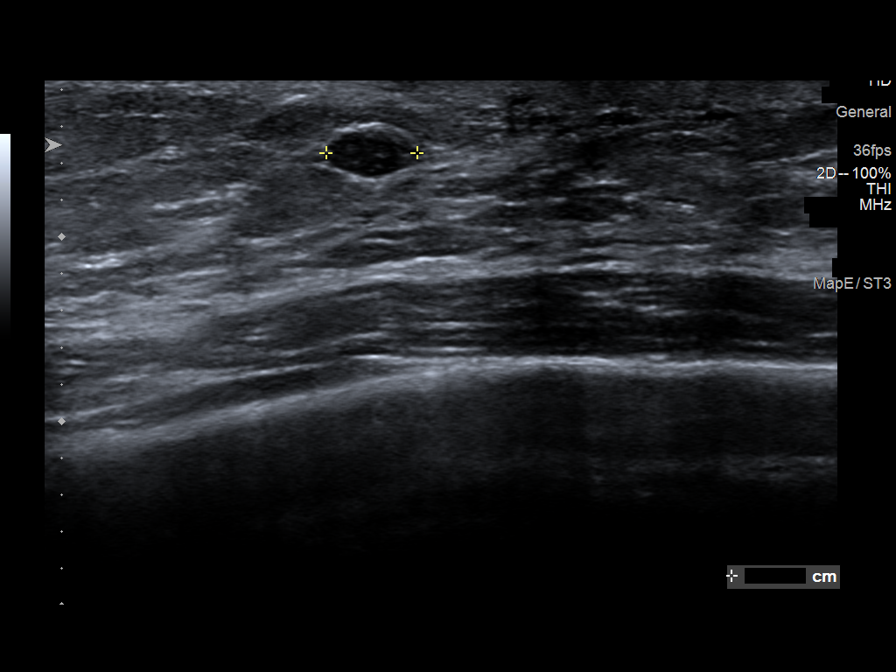
[im 7/7]
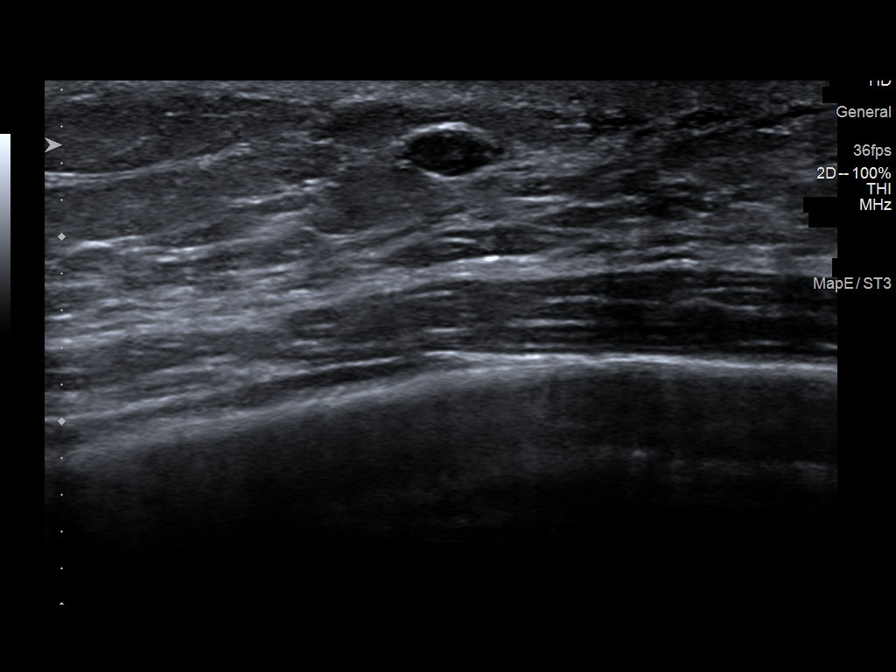

[7 of 7 positions shown; findings below may reference images not displayed]

ACR Breast Density Category b: There are scattered areas of
fibroglandular density.
FINDINGS: The possible mass noted in the lateral retroareolar right breast is
not visualized on the spot compression images. There are no defined
masses on the spot-compression images, no areas of architectural
distortion and no suspicious calcifications.

Targeted right breast ultrasound is performed, showing a small hypo
to anechoic mass, containing a few internal echoes and 1 peripheral
septation, in the 9 o'clock, anterior, retroareolar breast,
measuring 5 x 3 x 5 mm, consistent in size, shape and location to
the mammographic mass. The mass shows no internal blood flow on
color Doppler analysis.
IMPRESSION: 1. Probably benign 5 mm mass in the right breast, 9 o'clock
retroareolar, consistent with a complicated cyst. Short-term
follow-up recommended.

RECOMMENDATION:
Right breast ultrasound in 6 months to reassess the 9 o'clock
retroareolar probably benign mass.

I have discussed the findings and recommendations with the patient.
If applicable, a reminder letter will be sent to the patient
regarding the next appointment.

BI-RADS CATEGORY  3: Probably benign.

## 2024-02-01 ENCOUNTER — Other Ambulatory Visit: Payer: Self-pay | Admitting: Family Medicine

## 2024-02-01 DIAGNOSIS — Z1231 Encounter for screening mammogram for malignant neoplasm of breast: Secondary | ICD-10-CM

## 2024-02-03 ENCOUNTER — Encounter
